# Patient Record
Sex: Female | Born: 1975 | Hispanic: Yes | Marital: Married
Health system: Southern US, Community
[De-identification: ages and names within clinical notes are randomized; demographics above are authoritative.]

---

## 2017-01-30 ENCOUNTER — Emergency Department (HOSPITAL_COMMUNITY): Payer: No Typology Code available for payment source

## 2017-01-30 ENCOUNTER — Emergency Department (HOSPITAL_COMMUNITY)
Admission: EM | Admit: 2017-01-30 | Discharge: 2017-01-31 | Disposition: A | Discharge status: Home / Self Care | Payer: No Typology Code available for payment source | Attending: Emergency Medicine | Admitting: Emergency Medicine

## 2017-01-30 DIAGNOSIS — Y999 Unspecified external cause status: Secondary | ICD-10-CM | POA: Insufficient documentation

## 2017-01-30 DIAGNOSIS — Y939 Activity, unspecified: Secondary | ICD-10-CM | POA: Insufficient documentation

## 2017-01-30 DIAGNOSIS — S301XXA Contusion of abdominal wall, initial encounter: Secondary | ICD-10-CM | POA: Insufficient documentation

## 2017-01-30 DIAGNOSIS — S2020XA Contusion of thorax, unspecified, initial encounter: Secondary | ICD-10-CM | POA: Insufficient documentation

## 2017-01-30 DIAGNOSIS — S20219A Contusion of unspecified front wall of thorax, initial encounter: Secondary | ICD-10-CM

## 2017-01-30 DIAGNOSIS — S161XXA Strain of muscle, fascia and tendon at neck level, initial encounter: Secondary | ICD-10-CM | POA: Insufficient documentation

## 2017-01-30 DIAGNOSIS — S0990XA Unspecified injury of head, initial encounter: Secondary | ICD-10-CM | POA: Diagnosis present

## 2017-01-30 DIAGNOSIS — Y929 Unspecified place or not applicable: Secondary | ICD-10-CM | POA: Diagnosis not present

## 2017-01-30 DIAGNOSIS — S8002XA Contusion of left knee, initial encounter: Secondary | ICD-10-CM | POA: Insufficient documentation

## 2017-01-30 LAB — CBC WITH DIFFERENTIAL/PLATELET
BASOS PCT: 0 %
Basophils Absolute: 0 10*3/uL (ref 0.0–0.1)
Eosinophils Absolute: 0.1 10*3/uL (ref 0.0–0.7)
Eosinophils Relative: 2 %
HEMATOCRIT: 36.3 % (ref 36.0–46.0)
HEMOGLOBIN: 11.4 g/dL — AB (ref 12.0–15.0)
LYMPHS ABS: 1.8 10*3/uL (ref 0.7–4.0)
LYMPHS PCT: 30 %
MCH: 25.7 pg — AB (ref 26.0–34.0)
MCHC: 31.4 g/dL (ref 30.0–36.0)
MCV: 81.9 fL (ref 78.0–100.0)
MONOS PCT: 6 %
Monocytes Absolute: 0.4 10*3/uL (ref 0.1–1.0)
NEUTROS ABS: 3.8 10*3/uL (ref 1.7–7.7)
NEUTROS PCT: 62 %
Platelets: 257 10*3/uL (ref 150–400)
RBC: 4.43 MIL/uL (ref 3.87–5.11)
RDW: 17.1 % — ABNORMAL HIGH (ref 11.5–15.5)
WBC: 6.1 10*3/uL (ref 4.0–10.5)

## 2017-01-30 MED ORDER — IOPAMIDOL (ISOVUE-300) INJECTION 61%
INTRAVENOUS | Status: AC
  Administered 2017-01-31: 100 mL
  Filled 2017-01-30: qty 100

## 2017-01-30 MED ORDER — MORPHINE SULFATE (PF) 4 MG/ML IV SOLN
4.0000 mg | Freq: Once | INTRAVENOUS | Status: AC
  Administered 2017-01-30: 4 mg via INTRAVENOUS
  Filled 2017-01-30: qty 1

## 2017-01-30 MED ORDER — ONDANSETRON HCL 4 MG/2ML IJ SOLN
4.0000 mg | Freq: Once | INTRAMUSCULAR | Status: AC
  Administered 2017-01-30: 4 mg via INTRAVENOUS
  Filled 2017-01-30: qty 2
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
## 2017-01-31 ENCOUNTER — Emergency Department (HOSPITAL_COMMUNITY): Payer: No Typology Code available for payment source

## 2017-01-31 ENCOUNTER — Encounter (HOSPITAL_COMMUNITY): Payer: Self-pay | Admitting: Radiology

## 2017-01-31 LAB — BASIC METABOLIC PANEL
ANION GAP: 11 (ref 5–15)
BUN: 11 mg/dL (ref 6–20)
CHLORIDE: 102 mmol/L (ref 101–111)
CO2: 23 mmol/L (ref 22–32)
Calcium: 8.5 mg/dL — ABNORMAL LOW (ref 8.9–10.3)
Creatinine, Ser: 0.73 mg/dL (ref 0.44–1.00)
GFR calc non Af Amer: >60 mL/min (ref >60)
GLUCOSE: 133 mg/dL — AB (ref 65–99)
POTASSIUM: 3.6 mmol/L (ref 3.5–5.1)
Sodium: 136 mmol/L (ref 135–145)

## 2017-01-31 LAB — I-STAT BETA HCG BLOOD, ED (MC, WL, AP ONLY): I-stat hCG, quantitative: <5 m[IU]/mL (ref <5)

## 2017-01-31 MED ORDER — TRAMADOL HCL 50 MG PO TABS: 50.0000 mg | ORAL_TABLET | Freq: Four times a day (QID) | ORAL | 0 refills | Status: AC | PRN

## 2017-01-31 NOTE — Discharge Instructions (Signed)
Ibuprofen 600 mg every 6 hours as needed for pain.  Tramadol as prescribed as needed for pain not relieved with ibuprofen.  Follow-up with your primary doctor if your symptoms are not improving in the next week.
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
